# Patient Record
Sex: Male | Born: 2000 | Race: Black or African American | Hispanic: No | Marital: Single | Smoking: Never smoker
Health system: Southern US, Community
[De-identification: ages and names within clinical notes are randomized; demographics above are authoritative.]

---
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
## 2021-05-06 ENCOUNTER — Emergency Department (HOSPITAL_BASED_OUTPATIENT_CLINIC_OR_DEPARTMENT_OTHER): Payer: No Typology Code available for payment source

## 2021-05-06 ENCOUNTER — Other Ambulatory Visit: Payer: Self-pay

## 2021-05-06 ENCOUNTER — Encounter (HOSPITAL_BASED_OUTPATIENT_CLINIC_OR_DEPARTMENT_OTHER): Payer: Self-pay | Admitting: Emergency Medicine

## 2021-05-06 ENCOUNTER — Emergency Department (HOSPITAL_BASED_OUTPATIENT_CLINIC_OR_DEPARTMENT_OTHER)
Admission: EM | Admit: 2021-05-06 | Discharge: 2021-05-06 | Disposition: A | Discharge status: Home / Self Care | Payer: No Typology Code available for payment source | Attending: Emergency Medicine | Admitting: Emergency Medicine

## 2021-05-06 DIAGNOSIS — S4991XA Unspecified injury of right shoulder and upper arm, initial encounter: Secondary | ICD-10-CM | POA: Diagnosis present

## 2021-05-06 DIAGNOSIS — N342 Other urethritis: Secondary | ICD-10-CM | POA: Diagnosis not present

## 2021-05-06 DIAGNOSIS — S46811A Strain of other muscles, fascia and tendons at shoulder and upper arm level, right arm, initial encounter: Secondary | ICD-10-CM | POA: Insufficient documentation

## 2021-05-06 DIAGNOSIS — S20212A Contusion of left front wall of thorax, initial encounter: Secondary | ICD-10-CM | POA: Insufficient documentation

## 2021-05-06 DIAGNOSIS — Z7722 Contact with and (suspected) exposure to environmental tobacco smoke (acute) (chronic): Secondary | ICD-10-CM | POA: Insufficient documentation

## 2021-05-06 DIAGNOSIS — Y92410 Unspecified street and highway as the place of occurrence of the external cause: Secondary | ICD-10-CM | POA: Insufficient documentation

## 2021-05-06 LAB — URINALYSIS, ROUTINE W REFLEX MICROSCOPIC
Glucose, UA: NEGATIVE mg/dL
Ketones, ur: NEGATIVE mg/dL
Nitrite: NEGATIVE
Protein, ur: 30 mg/dL — AB
Specific Gravity, Urine: 1.02 (ref 1.005–1.030)
pH: 6.5 (ref 5.0–8.0)

## 2021-05-06 LAB — URINALYSIS, MICROSCOPIC (REFLEX): WBC, UA: >50 WBC/hpf (ref 0–5)

## 2021-05-06 MED ORDER — CEFTRIAXONE SODIUM 500 MG IJ SOLR
500.0000 mg | Freq: Once | INTRAMUSCULAR | Status: AC
  Administered 2021-05-06: 500 mg via INTRAMUSCULAR
  Filled 2021-05-06: qty 500

## 2021-05-06 MED ORDER — NAPROXEN 250 MG PO TABS
500.0000 mg | ORAL_TABLET | Freq: Once | ORAL | Status: AC
  Administered 2021-05-06: 500 mg via ORAL
  Filled 2021-05-06: qty 2

## 2021-05-06 MED ORDER — AZITHROMYCIN 1 G PO PACK
1.0000 g | PACK | Freq: Once | ORAL | Status: AC
  Administered 2021-05-06: 1 g via ORAL
  Filled 2021-05-06: qty 1
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
## 2021-05-08 LAB — GC/CHLAMYDIA PROBE AMP (~~LOC~~) NOT AT ARMC
Chlamydia: NEGATIVE
Comment: NEGATIVE
Comment: NORMAL
Neisseria Gonorrhea: POSITIVE — AB

## 2022-04-03 ENCOUNTER — Emergency Department (HOSPITAL_COMMUNITY)
Admission: EM | Admit: 2022-04-03 | Discharge: 2022-04-03 | Disposition: A | Discharge status: Home / Self Care | Payer: Self-pay | Attending: Emergency Medicine | Admitting: Emergency Medicine

## 2022-04-03 ENCOUNTER — Other Ambulatory Visit: Payer: Self-pay

## 2022-04-03 ENCOUNTER — Emergency Department (HOSPITAL_COMMUNITY): Payer: Self-pay

## 2022-04-03 ENCOUNTER — Encounter (HOSPITAL_COMMUNITY): Payer: Self-pay | Admitting: Emergency Medicine

## 2022-04-03 DIAGNOSIS — W228XXA Striking against or struck by other objects, initial encounter: Secondary | ICD-10-CM | POA: Insufficient documentation

## 2022-04-03 DIAGNOSIS — S62164A Nondisplaced fracture of pisiform, right wrist, initial encounter for closed fracture: Secondary | ICD-10-CM

## 2022-04-03 DIAGNOSIS — Y9389 Activity, other specified: Secondary | ICD-10-CM | POA: Insufficient documentation

## 2022-04-03 DIAGNOSIS — M25531 Pain in right wrist: Secondary | ICD-10-CM | POA: Insufficient documentation

## 2022-04-03 MED ORDER — IBUPROFEN 800 MG PO TABS
800.0000 mg | ORAL_TABLET | Freq: Once | ORAL | Status: AC
  Administered 2022-04-03: 800 mg via ORAL
  Filled 2022-04-03: qty 1

## 2022-04-03 NOTE — Progress Notes (Signed)
Orthopedic Tech Progress Note ?Patient Details:  ?George Juarez ?July 11, 2001 ?[id number] ? ?Ortho Devices ?Type of Ortho Device: Volar splint ?Ortho Device/Splint Location: rue ?Ortho Device/Splint Interventions: Ordered, Application, Adjustment ? I applied splint with only the flexion after speaking with the dr and them saying just do the flexion. ?Post Interventions ?Patient Tolerated: Well ?Instructions Provided: Care of device, Adjustment of device ? ?Trinna Post ?04/03/2022, 4:41 AM ? ?

## 2022-04-03 NOTE — ED Provider Notes (Signed)
?[facility] EMERGENCY DEPARTMENT ?Provider Note ? ? ?CSN: [id number] ?Arrival date & time: 04/03/22  2585 ? ?  ? ?History ? ?Chief Complaint  ?Patient presents with  ? Wrist Injury  ? ? ?George Juarez is a 21 y.o. male. ? ?HPI ?21 year old male presents to the ER with complaints of right wrist pain after punching an object while "playing around" this evening.  He notes pain to the right lateral wrist.  He has not taken anything for pain.  Denies numbness or tingling. ?  ? ?Home Medications ?Prior to Admission medications   ?Not on File  ?   ? ?Allergies    ?Patient has no known allergies.   ? ?Review of Systems   ?Review of Systems ?Ten systems reviewed and are negative for acute change, except as noted in the HPI.  ? ? ?Physical Exam ?Updated Vital Signs ?BP (!) 141/90   Pulse 91   Temp 98.4 ?F (36.9 ?C) (Oral)   Resp 16   SpO2 100%  ?Physical Exam ?Vitals reviewed.  ?Constitutional:   ?   Appearance: Normal appearance.  ?HENT:  ?   Head: Normocephalic and atraumatic.  ?Eyes:  ?   General:     ?   Right eye: No discharge.     ?   Left eye: No discharge.  ?   Extraocular Movements: Extraocular movements intact.  ?   Conjunctiva/sclera: Conjunctivae normal.  ?Musculoskeletal:     ?   General: Swelling, tenderness and signs of injury present. No deformity. Normal range of motion.  ?     Hands: ? ?   Comments: Tenderness to palpation to the right ulnocarpal joint, mild swelling noted.  2+ radial pulses,<2 cap refill.  No visible skin abrasions.  Patient able to flex and extend wrist however with pain   ?Skin: ?   Findings: No erythema.  ?Neurological:  ?   General: No focal deficit present.  ?   Mental Status: He is alert and oriented to person, place, and time.  ?   Sensory: No sensory deficit.  ?   Motor: No weakness.  ?Psychiatric:     ?   Mood and Affect: Mood normal.     ?   Behavior: Behavior normal.  ? ? ?ED Results / Procedures / Treatments   ?Labs ?(all labs ordered are listed, but only  abnormal results are displayed) ?Labs Reviewed - No data to display ? ?EKG ?None ? ?Radiology ?DG Wrist Complete Right ? ?Result Date: 04/03/2022 ?CLINICAL DATA:  Posttraumatic wrist pain EXAM: RIGHT WRIST - COMPLETE 3+ VIEW COMPARISON:  None Available. FINDINGS: Vertical fracture dividing the pisiform with mild regional fat reticulation. No dislocation. IMPRESSION: Nondisplaced pisiform fracture. Electronically Signed   By: Tiburcio Pea M.D.   On: 04/03/2022 03:59   ? ?Procedures ?Procedures  ? ? ?Medications Ordered in ED ?Medications  ?ibuprofen (ADVIL) tablet 800 mg (800 mg Oral Given 04/03/22 0419)  ? ? ?ED Course/ Medical Decision Making/ A&P ?  ?                        ?Medical Decision Making ?Amount and/or Complexity of Data Reviewed ?Radiology: ordered. ? ?Risk ?Prescription drug management. ? ?21 year old male presenting with right wrist pain after punching an object.  He has no evidence of skin abrasions, or open skin.  Neurovascularly intact on exam.  X-ray ordered in triage, reviewed and interpreted by me, agree with radiology.  Evidence of  nondisplaced pisiform fracture. Placed in a volar splint, given Ibuprofen for pain. Referral to ortho provided.  Instructed to take NSAIDS for pain. Return  precautions discussed.  Stable for discharge. ?Final Clinical Impression(s) / ED Diagnoses ?Final diagnoses:  ?Nondisplaced fracture of pisiform, right wrist, initial encounter for closed fracture  ? ? ?Rx / DC Orders ?ED Discharge Orders   ? ? None  ? ?  ? ? ?  ?Mare Ferrari, PA-C ?04/03/22 6967 ? ?  ?Shon Baton, MD ?04/03/22 [phone] ? ?

## 2022-04-03 NOTE — Discharge Instructions (Signed)
You were evaluated in the Emergency Department and after careful evaluation, we did not find any emergent condition requiring admission or further testing in the hospital. ? ?Your x-ray showed that you have fractured your pisiform bone in your hand.  Please wear the splint until better by orthopedics.  I provided Dr. Luvenia Starch phone number in your discharge paperwork, please call the phone number to schedule an appointment.  You may take 800 mg of ibuprofen up to 3 times daily for pain.  Make sure to take the medication with food. ? ? ?Please return to the Emergency Department if you experience any worsening of your condition. Thank you for allowing [place] to be a part of your care. ? ?

## 2022-04-03 NOTE — ED Notes (Signed)
Ortho tech called 

## 2022-04-03 NOTE — ED Triage Notes (Signed)
Pt reported to ED with c/o pain to right wrist after "playing around" and punching object this evening. Some swelling noted to area.  ?

## 2022-05-08 ENCOUNTER — Emergency Department (HOSPITAL_BASED_OUTPATIENT_CLINIC_OR_DEPARTMENT_OTHER): Payer: Self-pay

## 2022-05-08 ENCOUNTER — Encounter (HOSPITAL_BASED_OUTPATIENT_CLINIC_OR_DEPARTMENT_OTHER): Payer: Self-pay | Admitting: Emergency Medicine

## 2022-05-08 ENCOUNTER — Other Ambulatory Visit: Payer: Self-pay

## 2022-05-08 ENCOUNTER — Emergency Department (HOSPITAL_BASED_OUTPATIENT_CLINIC_OR_DEPARTMENT_OTHER)
Admission: EM | Admit: 2022-05-08 | Discharge: 2022-05-09 | Disposition: A | Discharge status: Home / Self Care | Payer: Self-pay | Attending: Emergency Medicine | Admitting: Emergency Medicine

## 2022-05-08 ENCOUNTER — Encounter (HOSPITAL_BASED_OUTPATIENT_CLINIC_OR_DEPARTMENT_OTHER): Payer: Self-pay

## 2022-05-08 ENCOUNTER — Emergency Department (HOSPITAL_BASED_OUTPATIENT_CLINIC_OR_DEPARTMENT_OTHER)
Admission: EM | Admit: 2022-05-08 | Discharge: 2022-05-08 | Disposition: A | Discharge status: Home / Self Care | Payer: Self-pay | Attending: Emergency Medicine | Admitting: Emergency Medicine

## 2022-05-08 DIAGNOSIS — S0101XA Laceration without foreign body of scalp, initial encounter: Secondary | ICD-10-CM | POA: Insufficient documentation

## 2022-05-08 DIAGNOSIS — S0990XA Unspecified injury of head, initial encounter: Secondary | ICD-10-CM

## 2022-05-08 DIAGNOSIS — X58XXXA Exposure to other specified factors, initial encounter: Secondary | ICD-10-CM | POA: Insufficient documentation

## 2022-05-08 DIAGNOSIS — Y9389 Activity, other specified: Secondary | ICD-10-CM | POA: Insufficient documentation

## 2022-05-08 DIAGNOSIS — W01198A Fall on same level from slipping, tripping and stumbling with subsequent striking against other object, initial encounter: Secondary | ICD-10-CM | POA: Insufficient documentation

## 2022-05-08 NOTE — ED Triage Notes (Signed)
Pt has laceration on top of head. Pt was here earlier and refused stitches. Pt has returned to get stitches now

## 2022-05-08 NOTE — ED Provider Notes (Signed)
[facility] EMERGENCY DEPARTMENT Provider Note   CSN: [id number] Arrival date & time: 05/08/22  1809     History  Chief Complaint  Patient presents with   Head Laceration    George Juarez is a 21 y.o. male with no provided medical history.  The patient presents to ED for evaluation of fall and head injury.  Patient states that prior to arrival, he tripped on his flip-flops causing him to fall backwards and strike the posterior side of his head.  Patient denies loss of consciousness, nausea, vomiting, back pain, neck pain, headache numbness, tingling.  Patient states that bleeding occurred at this time.  Patient is unsure of last tetanus update.  Patient reports he has laceration to posterior side of head.   Head Laceration Pertinent negatives include no headaches.       Home Medications Prior to Admission medications   Not on File      Allergies    Patient has no known allergies.    Review of Systems   Review of Systems  Gastrointestinal:  Negative for nausea and vomiting.  Musculoskeletal:  Negative for back pain and neck pain.  Skin:  Positive for wound.  Neurological:  Negative for syncope, numbness and headaches.  All other systems reviewed and are negative.   Physical Exam Updated Vital Signs BP 125/84   Pulse 76   Temp 98.8 F (37.1 C) (Oral)   Resp 18   Ht 5\' 6"  (1.676 m)   Wt 61.2 kg   SpO2 96%   BMI 21.79 kg/m  Physical Exam Vitals and nursing note reviewed.  Constitutional:      General: He is not in acute distress.    Appearance: Normal appearance. He is not ill-appearing, toxic-appearing or diaphoretic.  HENT:     Head: Normocephalic and atraumatic.     Nose: Nose normal. No congestion.     Mouth/Throat:     Mouth: Mucous membranes are moist.     Pharynx: Oropharynx is clear.  Eyes:     Extraocular Movements: Extraocular movements intact.     Conjunctiva/sclera: Conjunctivae normal.     Pupils: Pupils are equal, round, and  reactive to light.  Cardiovascular:     Rate and Rhythm: Normal rate and regular rhythm.  Pulmonary:     Effort: Pulmonary effort is normal.     Breath sounds: Normal breath sounds.  Abdominal:     General: Abdomen is flat. Bowel sounds are normal.     Palpations: Abdomen is soft.  Musculoskeletal:     Cervical back: Normal range of motion and neck supple. No rigidity or tenderness.  Skin:    General: Skin is warm and dry.     Capillary Refill: Capillary refill takes less than 2 seconds.     Comments: 2 cm laceration to patient posterior scalp. Bleeding controlled.   Neurological:     General: No focal deficit present.     Mental Status: He is alert and oriented to person, place, and time.     GCS: GCS eye subscore is 4. GCS verbal subscore is 5. GCS motor subscore is 6.     Cranial Nerves: Cranial nerves 2-12 are intact. No cranial nerve deficit.     Sensory: Sensation is intact. No sensory deficit.     Motor: Motor function is intact. No weakness.     Coordination: Coordination is intact. Heel to [facility] Test normal.     ED Results / Procedures / Treatments   Labs (  all labs ordered are listed, but only abnormal results are displayed) Labs Reviewed - No data to display  EKG None  Radiology CT Head Wo Contrast  Result Date: 05/08/2022 CLINICAL DATA:  Fall, hit back of head. Head trauma, moderate-severe EXAM: CT HEAD WITHOUT CONTRAST TECHNIQUE: Contiguous axial images were obtained from the base of the skull through the vertex without intravenous contrast. RADIATION DOSE REDUCTION: This exam was performed according to the departmental dose-optimization program which includes automated exposure control, adjustment of the mA and/or kV according to patient size and/or use of iterative reconstruction technique. COMPARISON:  None Available. FINDINGS: Brain: No acute intracranial abnormality. Specifically, no hemorrhage, hydrocephalus, mass lesion, acute infarction, or significant  intracranial injury. Vascular: No hyperdense vessel or unexpected calcification. Skull: No acute calvarial abnormality. Sinuses/Orbits: No acute findings Other: None IMPRESSION: Normal study. Electronically Signed   By: Charlett Nose M.D.   On: 05/08/2022 19:04    Procedures Procedures   Medications Ordered in ED Medications - No data to display  ED Course/ Medical Decision Making/ A&P                           Medical Decision Making Amount and/or Complexity of Data Reviewed Radiology: ordered.   [age] male presents to the ED for evaluation.  Please see HPI for further details.  On examination, patient is afebrile and nontachycardic.  Patient lung sounds clear bilaterally.  No focal neurodeficits noted on examination.  Patient has 2 cm laceration of posterior scalp with bleeding controlled.  Patient unsure of last tetanus update.  CT scan of patient head was ordered in triage.  The CT scan did not note any abnormalities, midline shift, herniation.  I attempted to repair this patient's laceration with staples however the patient stated that he did not wish to proceed with this.  Patient also deferred on updating his tetanus at this time stating "I do not like needles".  I educated the patient on the importance of tetanus vaccination as well as laceration repair and closure however he stated that he still did not wish to proceed with tetanus update or laceration repair.  I asked the patient what his expectations of care were today and he stated that "my friends made me come today".  Patient denies wishing to proceed with tetanus update or laceration repair.  Patient was advised to follow-up with PCP.  The patient was given return precautions and he voiced understanding.  The patient had all his questions answered his satisfaction.  The patient is stable this time for discharge home.   Final Clinical Impression(s) / ED Diagnoses Final diagnoses:  Laceration of scalp, initial encounter   Injury of head, initial encounter    Rx / DC Orders ED Discharge Orders     None         Marlene, Pfluger, PA-C 05/08/22 1918    Virgina Norfolk, DO 05/08/22 2120    Al Decant, PA-C 05/09/22 0025    Virgina Norfolk, DO 05/09/22 1458

## 2022-05-08 NOTE — Discharge Instructions (Signed)
Please return to the ED with any new symptoms or concern Please follow-up with your PCP for ongoing management of your scalp laceration Please read attached informational guide concerning nonsutured laceration care and ways to care for your laceration at home

## 2022-05-08 NOTE — ED Triage Notes (Signed)
States fell backwards and hit posterior head on concrete. Last tetanus unknown. Bleeding controlled during triage

## 2022-05-09 MED ORDER — LIDOCAINE-EPINEPHRINE (PF) 2 %-1:200000 IJ SOLN: 10.0000 mL | Freq: Once | INTRAMUSCULAR | Status: DC

## 2022-05-09 NOTE — ED Provider Notes (Signed)
   [facility] EMERGENCY DEPARTMENT  Provider Note  CSN: [id number] Arrival date & time: 05/08/22 2333  History Chief Complaint  Patient presents with   Laceration    George Juarez is a 21 y.o. male returns after a visit just a few hours ago for head injury. Had neg CT but refused TDAP and laceration repair. Now returns to have his laceration repaired.    Home Medications Prior to Admission medications   Not on File     Allergies    Patient has no known allergies.   Review of Systems   Review of Systems Please see HPI for pertinent positives and negatives  Physical Exam BP 139/75   Pulse (!) 102   Temp 97.8 F (36.6 C) (Oral)   Resp 16   Ht 5\' 6"  (1.676 m)   Wt 61.2 kg   SpO2 99%   BMI 21.78 kg/m   Physical Exam Vitals and nursing note reviewed.  HENT:     Head: Normocephalic.     Comments: 2cm laceration to L posterior scalp    Nose: Nose normal.  Eyes:     Extraocular Movements: Extraocular movements intact.  Pulmonary:     Effort: Pulmonary effort is normal.  Musculoskeletal:        General: Normal range of motion.     Cervical back: Neck supple.  Skin:    Findings: No rash (on exposed skin).  Neurological:     Mental Status: He is alert and oriented to person, place, and time.  Psychiatric:        Mood and Affect: Mood normal.     ED Results / Procedures / Treatments   EKG None  Procedures . Laceration Repair  Date/Time: 05/09/2022 12:40 AM  Performed by: 05/11/2022, MD Authorized by: Pollyann Savoy, MD   Consent:    Consent obtained:  Verbal   Consent given by:  Patient Anesthesia:    Anesthesia method:  None Laceration details:    Location:  Scalp   Scalp location:  L parietal   Length (cm):  2 Treatment:    Irrigation solution:  Sterile saline   Irrigation method:  Syringe Skin repair:    Repair method:  Staples   Number of staples:  1 Approximation:    Approximation:  Close Repair type:     Repair type:  Simple Post-procedure details:    Dressing:  Open (no dressing)   Procedure completion:  Tolerated well, no immediate complications   Medications Ordered in the ED Medications  lidocaine-EPINEPHrine (XYLOCAINE W/EPI) 2 %-1:200000 (PF) injection 10 mL (has no administration in time range)    Initial Impression and Plan  Patient's wound was irrigated with saline, but he again refused repair. Girlfriend at bedside will try to convince him.   ED Course   Clinical Course as of 05/09/22 0040  Wed May 09, 2022  May 11, 2022 After multiple attempts, patient eventually agreed to allow wound to be stapled. Only needed one staple so he declined injectable anesthetic.  [CS]    Clinical Course User Index [CS] 4481, MD     MDM Rules/Calculators/A&P Medical Decision Making Risk Prescription drug management.    Final Clinical Impression(s) / ED Diagnoses Final diagnoses:  Laceration of scalp, initial encounter    Rx / DC Orders ED Discharge Orders     None        Pollyann Savoy, MD 05/09/22 0040

## 2022-05-09 NOTE — ED Notes (Signed)
Patient did not want to wait for recheck of vital signs at time of discharge

## 2022-05-15 ENCOUNTER — Emergency Department (HOSPITAL_BASED_OUTPATIENT_CLINIC_OR_DEPARTMENT_OTHER)
Admission: EM | Admit: 2022-05-15 | Discharge: 2022-05-15 | Disposition: A | Discharge status: Home / Self Care | Payer: Self-pay | Attending: Emergency Medicine | Admitting: Emergency Medicine

## 2022-05-15 ENCOUNTER — Encounter (HOSPITAL_BASED_OUTPATIENT_CLINIC_OR_DEPARTMENT_OTHER): Payer: Self-pay

## 2022-05-15 ENCOUNTER — Other Ambulatory Visit: Payer: Self-pay

## 2022-05-15 DIAGNOSIS — Z4802 Encounter for removal of sutures: Secondary | ICD-10-CM | POA: Insufficient documentation

## 2022-05-15 NOTE — ED Triage Notes (Signed)
Pt here to get staples taken out on back of head

## 2022-05-15 NOTE — ED Provider Notes (Signed)
[facility] EMERGENCY DEPARTMENT Provider Note   CSN: [id number] Arrival date & time: 05/15/22  1722     History  Chief Complaint  Patient presents with   Suture / Staple Removal    George Juarez is a 21 y.o. male with no medical history.  The patient returns to the ED for staple removal.  Patient initially had staples placed on 05/08/2022.  Patient denies any nausea, vomiting, fevers, body aches or chills.  Patient tetanus updated on 6/13.   Suture / Staple Removal       Home Medications Prior to Admission medications   Not on File      Allergies    Patient has no known allergies.    Review of Systems   Review of Systems  All other systems reviewed and are negative.   Physical Exam Updated Vital Signs BP (!) 146/80 (BP Location: Right Arm)   Pulse (!) 54   Temp 98.4 F (36.9 C) (Oral)   Resp 18   Ht 5\' 6"  (1.676 m)   Wt 60.8 kg   SpO2 97%   BMI 21.63 kg/m  Physical Exam Vitals and nursing note reviewed.  Constitutional:      General: He is not in acute distress.    Appearance: He is not toxic-appearing.  HENT:     Head: Normocephalic and atraumatic.     Nose: Nose normal. No congestion.     Mouth/Throat:     Mouth: Mucous membranes are moist.     Pharynx: Oropharynx is clear.  Eyes:     Extraocular Movements: Extraocular movements intact.     Conjunctiva/sclera: Conjunctivae normal.     Pupils: Pupils are equal, round, and reactive to light.  Cardiovascular:     Rate and Rhythm: Normal rate and regular rhythm.  Pulmonary:     Effort: No respiratory distress.  Abdominal:     General: Abdomen is flat. Bowel sounds are normal.     Palpations: Abdomen is soft.     Tenderness: There is no abdominal tenderness.  Musculoskeletal:     Cervical back: Normal range of motion and neck supple. No tenderness.  Skin:    General: Skin is warm and dry.     Capillary Refill: Capillary refill takes less than 2 seconds.     Coloration: Skin is  not jaundiced or pale.  Neurological:     Mental Status: He is alert and oriented to person, place, and time.  Psychiatric:        Behavior: Behavior normal.     ED Results / Procedures / Treatments   Labs (all labs ordered are listed, but only abnormal results are displayed) Labs Reviewed - No data to display  EKG None  Radiology No results found.  Procedures Procedures   Medications Ordered in ED Medications - No data to display  ED Course/ Medical Decision Making/ A&P                           Medical Decision Making  21 year old male presents to ED for staple removal.  Please see HPI for further details.  Patient has wound located on scalp.  The wound has no surrounding erythema, drainage.  The wound appears to be healing well.  Staples removed.  Patient advised to follow-up with PCP or return to ED with any new concerns or symptoms.  Patient voices understanding.  Patient stable for discharge.  Final Clinical Impression(s) / ED Diagnoses Final  diagnoses:  Encounter for staple removal    Rx / DC Orders ED Discharge Orders     None         [place], [place] 05/15/22 Mee Hives, MD 05/17/22 1429

## 2022-05-15 NOTE — Discharge Instructions (Addendum)
Please return to ED with any new signs or symptoms Please read attached guide concerning suture removal care
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
## 2022-11-04 ENCOUNTER — Emergency Department (HOSPITAL_COMMUNITY): Payer: Self-pay

## 2022-11-04 ENCOUNTER — Other Ambulatory Visit: Payer: Self-pay

## 2022-11-04 ENCOUNTER — Emergency Department (HOSPITAL_COMMUNITY)
Admission: EM | Admit: 2022-11-04 | Discharge: 2022-11-05 | Disposition: A | Discharge status: Home / Self Care | Payer: Self-pay | Attending: Emergency Medicine | Admitting: Emergency Medicine

## 2022-11-04 DIAGNOSIS — M542 Cervicalgia: Secondary | ICD-10-CM | POA: Insufficient documentation

## 2022-11-04 DIAGNOSIS — M549 Dorsalgia, unspecified: Secondary | ICD-10-CM

## 2022-11-04 DIAGNOSIS — M546 Pain in thoracic spine: Secondary | ICD-10-CM | POA: Insufficient documentation

## 2022-11-04 DIAGNOSIS — Y92410 Unspecified street and highway as the place of occurrence of the external cause: Secondary | ICD-10-CM | POA: Insufficient documentation

## 2022-11-04 MED ORDER — OXYCODONE-ACETAMINOPHEN 5-325 MG PO TABS
1.0000 | ORAL_TABLET | Freq: Once | ORAL | Status: AC
  Administered 2022-11-04: 1 via ORAL
  Filled 2022-11-04: qty 1
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
## 2022-11-05 ENCOUNTER — Encounter (HOSPITAL_COMMUNITY): Payer: Self-pay

## 2022-11-05 ENCOUNTER — Emergency Department (HOSPITAL_COMMUNITY): Payer: Self-pay
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
